# Patient Record
Sex: Male | Born: 1955 | Race: White | Hispanic: No | Marital: Married | Smoking: Never smoker
Health system: Southern US, Community
[De-identification: ages and names within clinical notes are randomized; demographics above are authoritative.]

## PROBLEM LIST (undated history)

## (undated) DIAGNOSIS — K56609 Unspecified intestinal obstruction, unspecified as to partial versus complete obstruction: Secondary | ICD-10-CM

## (undated) DIAGNOSIS — IMO0002 Reserved for concepts with insufficient information to code with codable children: Secondary | ICD-10-CM

## (undated) DIAGNOSIS — I10 Essential (primary) hypertension: Secondary | ICD-10-CM

## (undated) DIAGNOSIS — K649 Unspecified hemorrhoids: Secondary | ICD-10-CM

## (undated) DIAGNOSIS — E039 Hypothyroidism, unspecified: Secondary | ICD-10-CM

## (undated) DIAGNOSIS — C73 Malignant neoplasm of thyroid gland: Secondary | ICD-10-CM

## (undated) HISTORY — DX: Essential (primary) hypertension: I10

## (undated) HISTORY — PX: KNEE SURGERY: SHX244

## (undated) HISTORY — DX: Hypothyroidism, unspecified: E03.9

## (undated) HISTORY — DX: Unspecified hemorrhoids: K64.9

## (undated) HISTORY — DX: Reserved for concepts with insufficient information to code with codable children: IMO0002

## (undated) HISTORY — DX: Malignant neoplasm of thyroid gland: C73

## (undated) HISTORY — DX: Unspecified intestinal obstruction, unspecified as to partial versus complete obstruction: K56.609

## (undated) HISTORY — PX: THYROIDECTOMY: SHX17

---

## 2004-10-11 DIAGNOSIS — IMO0002 Reserved for concepts with insufficient information to code with codable children: Secondary | ICD-10-CM

## 2004-10-11 HISTORY — DX: Reserved for concepts with insufficient information to code with codable children: IMO0002

## 2008-10-11 DIAGNOSIS — K56609 Unspecified intestinal obstruction, unspecified as to partial versus complete obstruction: Secondary | ICD-10-CM

## 2008-10-11 HISTORY — DX: Unspecified intestinal obstruction, unspecified as to partial versus complete obstruction: K56.609

## 2009-04-20 ENCOUNTER — Inpatient Hospital Stay (HOSPITAL_COMMUNITY): Admission: EM | Admit: 2009-04-20 | Discharge: 2009-04-24 | Payer: Self-pay | Admitting: Emergency Medicine

## 2009-04-23 ENCOUNTER — Encounter (INDEPENDENT_AMBULATORY_CARE_PROVIDER_SITE_OTHER): Payer: Self-pay | Admitting: Gastroenterology

## 2010-06-24 IMAGING — CT CT ENTERO ABD W/CM
2 of 6 series · 16 of 46 positions shown, 18 images · IV contrast (APPLIED)
Comparison: CT abdomen pelvis 04/20/2009

CT ABDOMEN:

CLINICAL DATA: Abdominal pain small bowel obstruction. Concern for
Crohn's disease or ileitis.

CT ABDOMEN AND PELVIS WITH CONTRAST (CT ENTEROGRAPHY)
TECHNIQUE: Multidetector CT of the abdomen and pelvis during bolus
administration of intravenous contrast. Negative oral contrast
VoLumen was given.
Contrast: 100 ml Omnipaque 300 intravenously and 1448 ml VoLumen
orally.

[Series 3: abd/pelv with 2.0 b31f st · axial · 0.72mm/px · z∈[+794,+1314]mm · 13 of 292 slices shown, 15 images]
[im 16/292  soft-tissue]
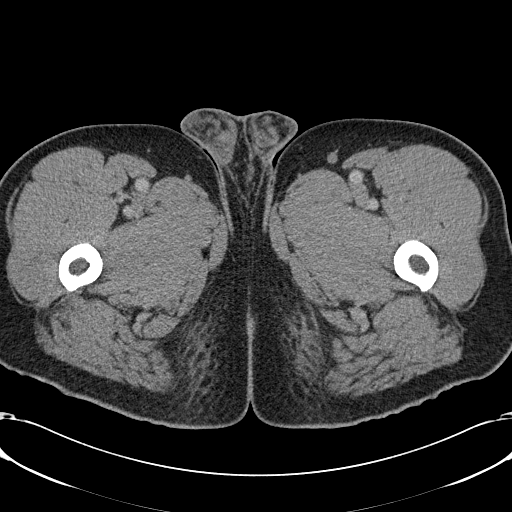
[im 16/292  bone]
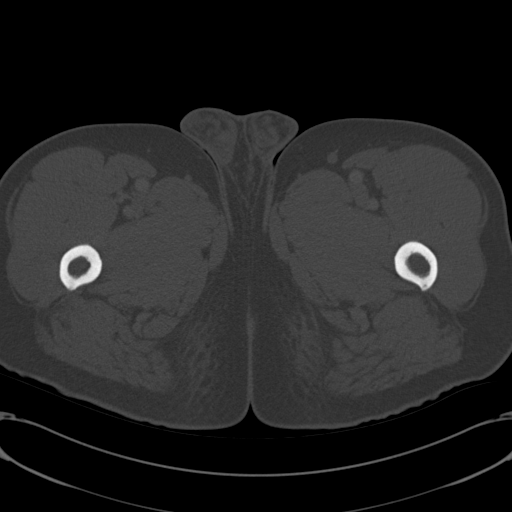
[im 46/292  soft-tissue]
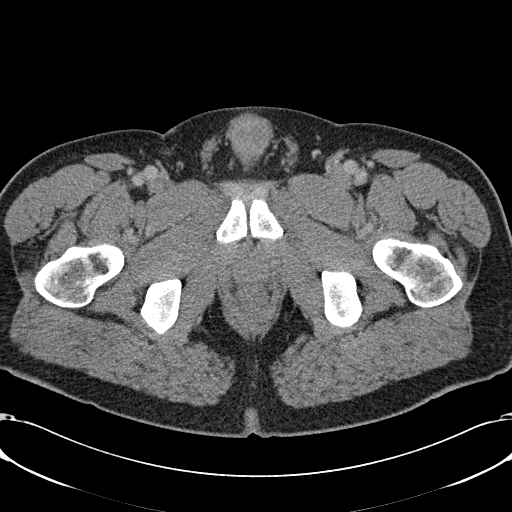
[im 62/292  soft-tissue]
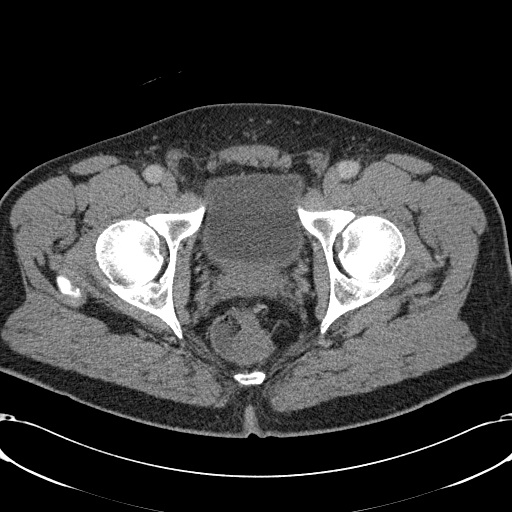
[im 77/292  soft-tissue]
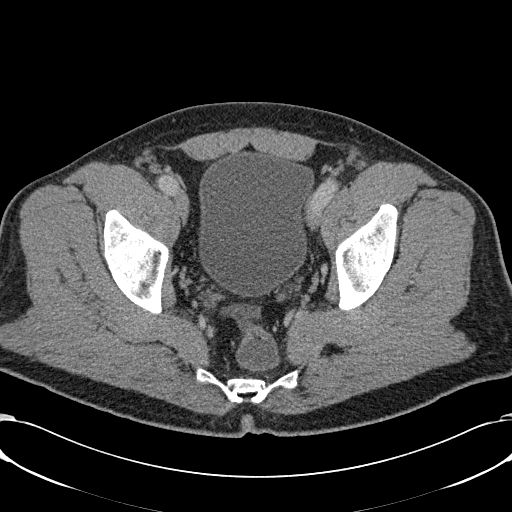
[im 108/292  soft-tissue]
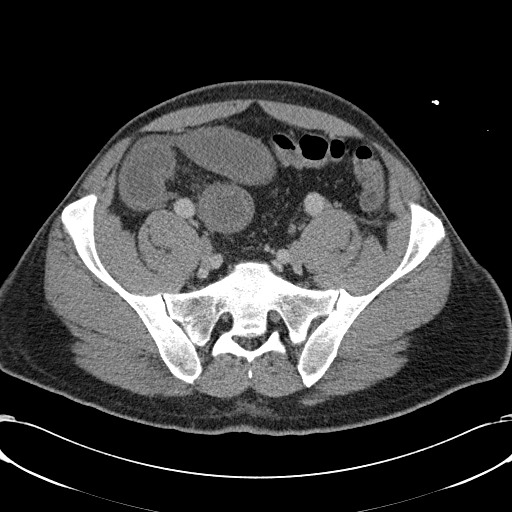
[im 123/292  soft-tissue]
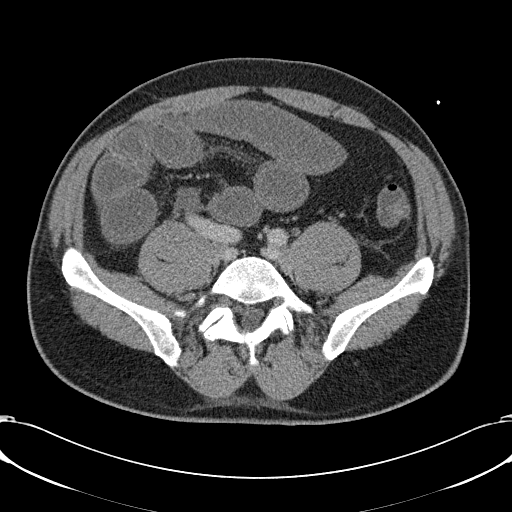
[im 154/292  soft-tissue]
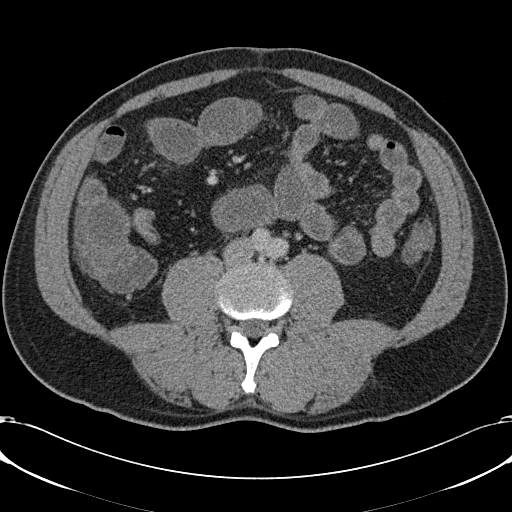
[im 169/292  soft-tissue]
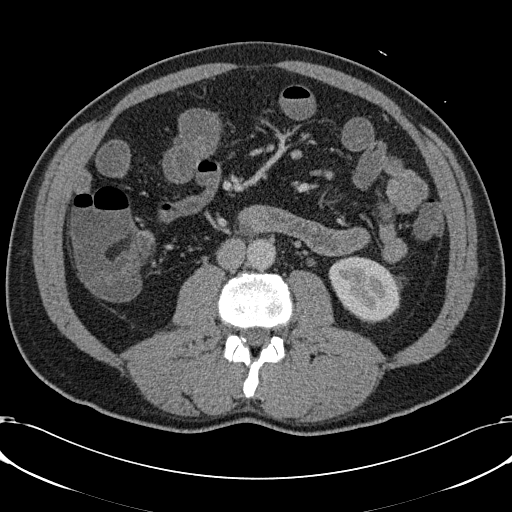
[im 184/292  soft-tissue]
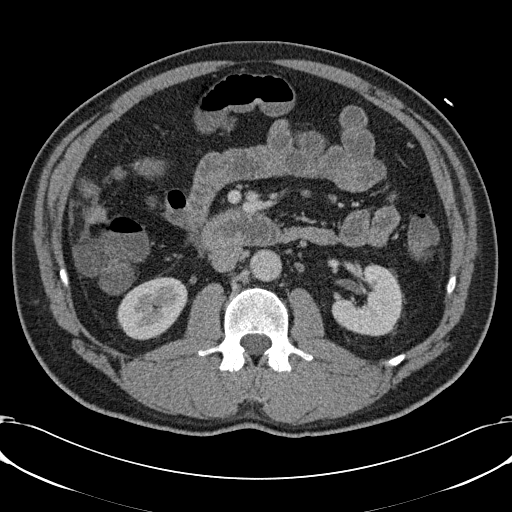
[im 184/292  bone]
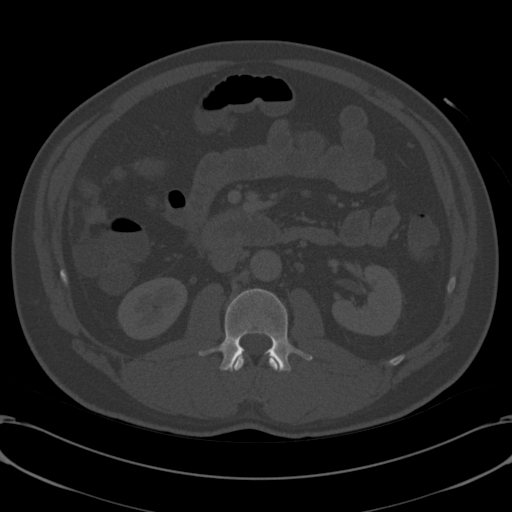
[im 215/292  soft-tissue]
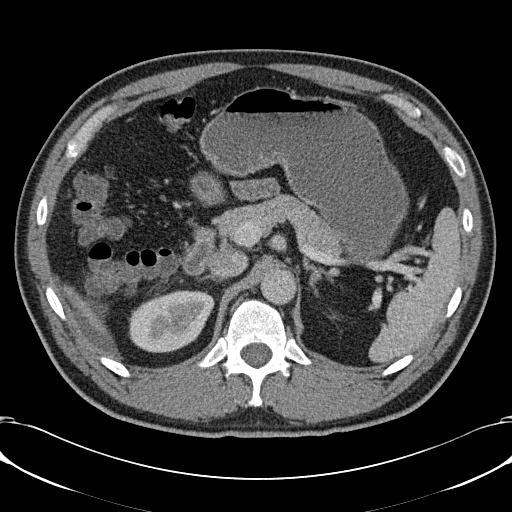
[im 230/292  soft-tissue]
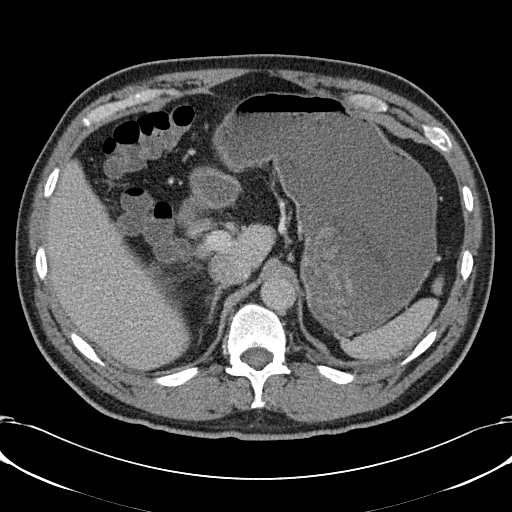
[im 246/292  soft-tissue]
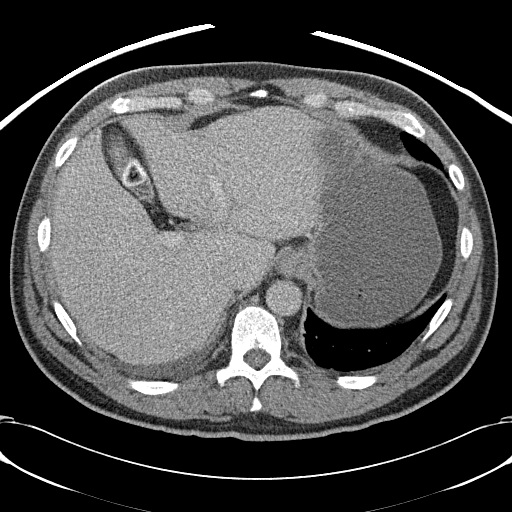
[im 276/292  soft-tissue]
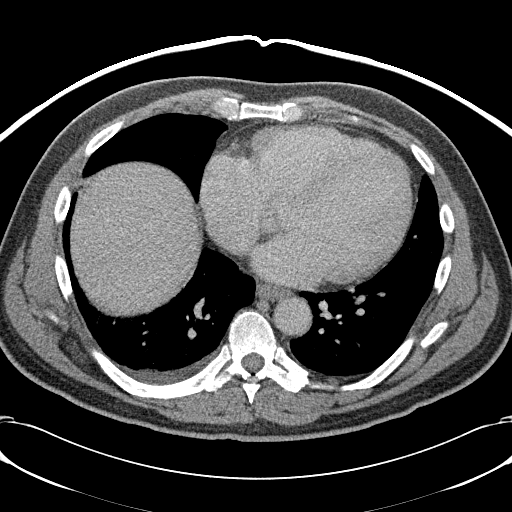

[Series 602: coronals · coronal · 1.14mm/px · 3 of 88 slices shown]
[im 30/88  soft-tissue]
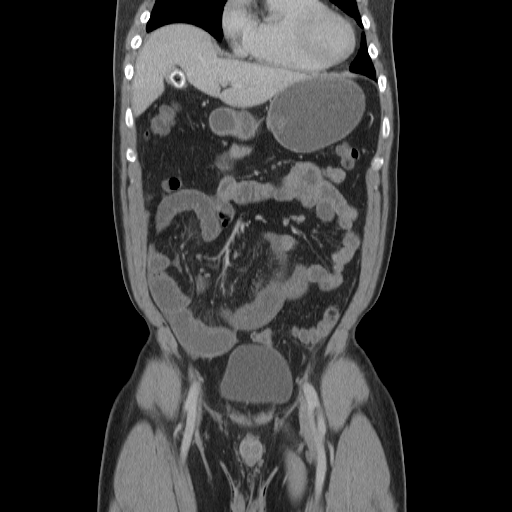
[im 39/88  soft-tissue]
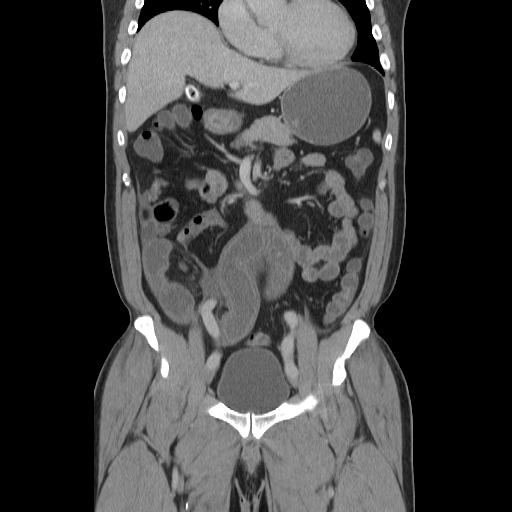
[im 49/88  soft-tissue]
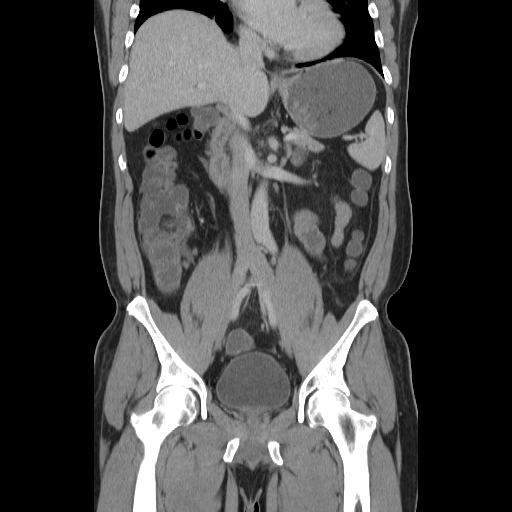

[16 of 46 positions shown; findings below may reference images not displayed]

FINDINGS: There is a new small right pleural effusion.  There is
mild right basilar atelectasis.  Heart appears normal.

No focal hepatic lesion.  There are multiple gallstones within
collapsed bladder.  No evidence of intrahepatic biliary ductal
dilatation.  The pancreas, spleen, adrenal glands, and kidneys
appear normal.

The stomach and duodenum appear normal.  The proximal small bowel
is not dilated and appears normal.  Within the mid ileum, there a
long segment of bowel wall thickening and a moderate amount of
dilatation (3.5 cm) with fluid retention.  The retained fecalalized
bowel contents seen on prior have been cleared  suggesting improved
partial small bowel obstruction.  The bowel wall thickening appears
slightly increased from prior measuring 6 mm compared to
approximately 2 mm on prior.  There is no clear focal persistent
stricturing or obstructing lesion.  The terminal ileum appears
normal without evidence of terminal ileitis.  The colon is
predominate collapsed but does contain a small amount of fluid.
There is fluid within the rectum and sigmoid colon.

Abdominal aorta is normal in caliber.  No evidence of
lymphadenopathy.
IMPRESSION: 1. Persistent long segment of the circumferential ileal bowel wall
thickening with improved partial obstruction.  The bowel wall
thickening appears slightly worsened.  There is no evidence of
inflammation at the terminal ileum to suggest typical Crohn's
disease.  Differential would include bowel wall edema from
infectious or inflammatory enteritis and  ischemia.  Bowel wall
hemorrhage or infiltration seems unlikely.

2. Cholelithiasis without evidence of cholecystitis.
3.  New right pleural effusion.

CT PELVIS:
FINDINGS: There is decrease in amount of free fluid in the pelvis.
The bladder prostate appear normal.  Rectum and sigmoid colon
appear normal.  No evidence of pelvic lymphadenopathy. Review of
bone windows demonstrates no aggressive osseous lesions.
IMPRESSION: Interval decrease in the smaller free fluid in the pelvis.

## 2011-01-17 LAB — CBC
HCT: 33.9 % — ABNORMAL LOW (ref 39.0–52.0)
HCT: 39.5 % (ref 39.0–52.0)
HCT: 43 % (ref 39.0–52.0)
Hemoglobin: 13.5 g/dL (ref 13.0–17.0)
MCHC: 34.3 g/dL (ref 30.0–36.0)
MCV: 93.7 fL (ref 78.0–100.0)
MCV: 93.8 fL (ref 78.0–100.0)
MCV: 94.2 fL (ref 78.0–100.0)
Platelets: 218 10*3/uL (ref 150–400)
Platelets: 250 10*3/uL (ref 150–400)
RBC: 4.59 MIL/uL (ref 4.22–5.81)
RDW: 13.1 % (ref 11.5–15.5)
RDW: 13.5 % (ref 11.5–15.5)
WBC: 12.1 10*3/uL — ABNORMAL HIGH (ref 4.0–10.5)

## 2011-01-17 LAB — BASIC METABOLIC PANEL
BUN: 12 mg/dL (ref 6–23)
BUN: 16 mg/dL (ref 6–23)
CO2: 29 mEq/L (ref 19–32)
Chloride: 105 mEq/L (ref 96–112)
Chloride: 108 mEq/L (ref 96–112)
Creatinine, Ser: 0.91 mg/dL (ref 0.4–1.5)
GFR calc non Af Amer: >60 mL/min (ref >60)
GFR calc non Af Amer: >60 mL/min (ref >60)
Glucose, Bld: 105 mg/dL — ABNORMAL HIGH (ref 70–99)
Glucose, Bld: 106 mg/dL — ABNORMAL HIGH (ref 70–99)
Potassium: 3.2 mEq/L — ABNORMAL LOW (ref 3.5–5.1)
Potassium: 3.4 mEq/L — ABNORMAL LOW (ref 3.5–5.1)
Sodium: 143 mEq/L (ref 135–145)

## 2011-01-17 LAB — URINALYSIS, ROUTINE W REFLEX MICROSCOPIC
Bilirubin Urine: NEGATIVE
Glucose, UA: NEGATIVE mg/dL
Hgb urine dipstick: NEGATIVE
Ketones, ur: NEGATIVE mg/dL
Protein, ur: NEGATIVE mg/dL
pH: 5.5 (ref 5.0–8.0)

## 2011-01-17 LAB — PROTIME-INR: INR: 1.1 (ref 0.00–1.49)

## 2011-01-17 LAB — HEPATIC FUNCTION PANEL
ALT: 20 U/L (ref 0–53)
ALT: 25 U/L (ref 0–53)
Albumin: 4.1 g/dL (ref 3.5–5.2)
Alkaline Phosphatase: 75 U/L (ref 39–117)
Bilirubin, Direct: <0.1 mg/dL (ref 0.0–0.3)
Indirect Bilirubin: 0.9 mg/dL (ref 0.3–0.9)
Total Bilirubin: 1.4 mg/dL — ABNORMAL HIGH (ref 0.3–1.2)
Total Protein: 6.3 g/dL (ref 6.0–8.3)

## 2011-01-17 LAB — DIFFERENTIAL
Basophils Absolute: 0 10*3/uL (ref 0.0–0.1)
Eosinophils Absolute: 0.2 10*3/uL (ref 0.0–0.7)
Eosinophils Absolute: 0.3 10*3/uL (ref 0.0–0.7)
Eosinophils Relative: 2 % (ref 0–5)
Eosinophils Relative: 2 % (ref 0–5)
Lymphocytes Relative: 13 % (ref 12–46)
Lymphocytes Relative: 8 % — ABNORMAL LOW (ref 12–46)
Lymphs Abs: 1 10*3/uL (ref 0.7–4.0)
Monocytes Absolute: 0.9 10*3/uL (ref 0.1–1.0)
Monocytes Relative: 5 % (ref 3–12)

## 2011-01-17 LAB — POCT I-STAT, CHEM 8
BUN: 20 mg/dL (ref 6–23)
Calcium, Ion: 1.05 mmol/L — ABNORMAL LOW (ref 1.12–1.32)
Chloride: 106 mEq/L (ref 96–112)
Glucose, Bld: 125 mg/dL — ABNORMAL HIGH (ref 70–99)
TCO2: 26 mmol/L (ref 0–100)

## 2011-01-17 LAB — LIPASE, BLOOD: Lipase: 18 U/L (ref 11–59)

## 2011-01-17 LAB — MAGNESIUM: Magnesium: 2.2 mg/dL (ref 1.5–2.5)

## 2011-02-23 NOTE — H&P (Signed)
Jared [name], Jared [name] NO.:  [id number]   MEDICAL RECORD NO.:  [id number]          PATIENT TYPE:  INP   LOCATION:  1823                         FACILITY:  [facility]   PHYSICIAN:  Lucile Crater, MD         DATE OF BIRTH:  1956-10-08   DATE OF ADMISSION:  04/20/2009  DATE OF DISCHARGE:                              HISTORY & PHYSICAL   PRIMARY CARE PHYSICIAN:  L. Lupe Carney, M.D.   CHIEF COMPLAINT:  Abdominal pain.   HISTORY OF PRESENT ILLNESS:  Mr.  [name] is a 55 year old Caucasian male  who presented to the emergency room complaining of abdominal pain.  The  pain actually started 3-4 days ago.  It was very mild in intensity when  it started.  It gradually worsened, and last night the pain was  unbearable, and so Jared [name] decided to come to the emergency room.  The  pain is located mostly in the epigastric region with no radiation. It is  tender.  It is described as a sharp pain.  There are no specific  aggravating or alleviating factors, but the pain was severe enough that  he was restless and moving around in bed.  The pain is associated with  nausea. He had an episode of vomiting which worsened the pain, but there  was no hematemesis.  There was no association of the abdominal pain with  bowel movements. He reports no change in bowel movements. He denies any  joint pains or recent changes.  His sister has a history of irritable  bowel syndrome as well as Crohn's disease.  He denies any fevers or  chills.  He denies having any bloody bowel movements. There is no  melena. There is no heartburn.   A CT scan of the abdomen was obtained in the emergency room, and it  showed evidence of small bowel obstruction, and there is a little area  of narrowing within the ilium and the right pelvis with apparent wall-  thickening/edema of the small bowel distal to this point.  This was  concerning for distal ileal inflammation/enteritis or Crohn's disease  with SBO.  Also, there  is moderate mesenteric inflammation and a small  amount of free fluid, but there is no evidence of pneumoperitoneum on  the CAT scan, and the appendix was visualized to be normal.   REVIEW OF SYSTEMS:  A complete review of systems was done,which include  general, head, eyes, ears, nose, and throat, cardiovascular,  respiratory, GI, GU, endocrine, musculoskeletal, skin, neurological, and  psychiatric. All are within normal limits other than what is mentioned  above.   PAST MEDICAL HISTORY:  1. Thyroid cancer status post total thyroidectomy.  2. Hypothyroidism status post thyroidectomy.  3. Hypertension.   ALLERGIES:  None.   MEDICATIONS AT HOME:  1. Diovan 160 or 220 mg once per day.  The patient is not sure about      the dose.  2. Levothyroxine 200 mcg 1 tablet p.o. daily.   SOCIAL HISTORY:  There is no history of tobacco abuse.  He  drinks  alcohol occasionally.  There is no history of drug use. He recently  moved from [place] to [place].  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   FAMILY HISTORY:  Positive for irritable bowel syndrome, diverticulitis,  Crohn's disease in a sister.   PHYSICAL EXAMINATION:  VITAL SIGNS:  T max 97.5, pulse rate 58-68, blood  pressure 139/87. Respirations 16.  O2 sat 97% on room air.  GENERAL:  Not in acute distress; alert, awake and oriented x3. Afebrile.  HEENT:  Normocephalic, atraumatic.  Pupils are equal, round, and  reactive to light and accommodation.  Extraocular movements are intact.  The mucous membranes are moist.  NECK: Supple.  No JVD, lymphadenopathy, or carotid bruits.  CARDIOVASCULAR:  Regular rhythm, rate is normal. No murmur, rubs or  gallops.  LUNGS:  Clear to auscultation bilaterally.  ABDOMEN: Soft. Tenderness most noted in the epigastric region.  Mild  tenderness in the right lower quadrant as well.  There is no rebound or  guarding.  There is no hepatosplenomegaly.  EXTREMITIES: No clubbing, cyanosis, or edema.   NEUROLOGICAL:  Exam is grossly nonfocal.   LABS AND STUDIES:  A CT scan of the abdomen and pelvis with contrast:  Evidence of small bowel obstruction. Relative area of narrowing is  identified within the ilium and the right pelvis with apparent wall-  thickening/edema of the small bowel distal to this point.  Distal ileal  inflammation/enteritis or Crohn's disease with small bowel obstruction  is a consideration.  Moderate mesenteric inflammation and small amount  of free fluid. There is no evidence of pneumoperitoneum.  Normal  appendix.   White blood cell count 12,100, hemoglobin 15.3, hematocrit 45, platelets  297, neutrophils 84%, lymphocytes 8%, monocytes 5%, eosinophils 2%,  basophils 0.  Sodium 138, potassium 4.5, chloride 106, BUN 20,  creatinine 1.2, blood glucose 125. Total protein 7.2, albumin 12.1, ALT  25, AST 45, alkaline phosphatase 75.  Total bilirubin 1.4, direct  bilirubin 0.5.  Urinalysis within normal limits.  Serum lipase is 18.   ASSESSMENT:  1. Abdominal pain.  There is evidence of small bowel obstruction with      a concern for Crohn's disease versus ileal inflammation on the CAT      scan.  A surgery consult was obtained we are still awaiting.  Will      make the patient NPO and start him on IV fluids to hydrate him.      Will control his pain.  Regarding the questionable ileal      inflammation/Crohn's disease, there is no history of Crohn's      before. The patient might benefit from a GI consult.  If there is      evidence of Crohn's disease on endoscopy and biopsy, likely will      start medications.  2. Hypertension. Will continue Diovan.  3. Hypothyroidism. Will check a TSH and will continue levothyroxine.  4. Deep vein thrombosis prophylaxis with sequential compression      devices.  5. Fluids, electrolytes and nutrition.  Will make the patient NPO.      Will replace electrolytes as needed.   DISPOSITION:  Will admit the patient to the floor and  closely observe.      Lucile Crater, MD  Electronically Signed     TA/MEDQ  D:  04/20/2009  T:  04/20/2009  Job:  [id number]   cc:   L. Lupe Carney, M.D.

## 2011-02-23 NOTE — Consult Note (Signed)
Jared [name], Jared [name] NO.:  [id number]   MEDICAL RECORD NO.:  [id number]          PATIENT TYPE:  INP   LOCATION:  5118                         FACILITY:  [facility]   PHYSICIAN:  Juanetta Gosling, MDDATE OF BIRTH:  [birth year]   DATE OF CONSULTATION:  DATE OF DISCHARGE:                                 CONSULTATION   PRIMARY CARE PHYSICIAN:  L. Lupe Carney, M.D.   CONSULTING PHYSICIAN:  Paula Libra, MD   CHIEF COMPLAINT:  Abdominal pain, nausea, and vomiting.   HISTORY OF PRESENT ILLNESS:  This is a 55 year old male who was  otherwise healthy, developed epigastric pain about 1300 yesterday.  It  became progressively worse and caused him to come to the emergency room.  He had no real relieving or aggravating factors.  He was nauseated, had  multiple episodes of bilious emesis.  He is still nauseated currently  and feels very bloated.  He has no appetite.  He has had a bowel  movement in the last 48 hours, and he is passing a small amount of  flatus but much decreased from normal.  Has had no fevers.  He had no  prior episodes of this sort of pain either.   PAST MEDICAL HISTORY:  Hypertension and hypothyroidism.   PAST SURGICAL HISTORY:  Total thyroidectomy for thyroid cancer, status  post radioactive iodine, and knee surgery.   MEDICATIONS:  Diovan and Synthroid.   ALLERGIES:  He has no known drug allergies.   SOCIAL HISTORY:  He is a nonsmoker.  Drinks occasional alcohol.  He is  attorney for the county.   FAMILY HISTORY:  Positive for diverticulitis.  He also has a sister with  Crohn disease and multiple other autoimmune diseases.   REVIEW OF SYSTEMS:  He has a negative cardiac history.  Had a  colonoscopy in the last 3 years with benign polyps that was obtained in  [place], [place].   PHYSICAL EXAMINATION:  VITAL SIGNS:  97.0, 68, 149/94, 14.  GENERAL:  He is well appearing, in no apparent distress.  HEART:  Regular rate and rhythm.  LUNGS:  Clear bilaterally.  ABDOMEN:  Moderately distended.  He has some occasional bowel sounds.  Mildly tender in his epigastrium and right lower quadrant without any  evidence of peritoneal signs.  There is no inguinal hernia.  EXTREMITIES:  No edema.   LABORATORY EVALUATION:  White blood cell count of 12.1, hematocrit 43,  and platelets 297 with a left shift of 84 segs.  His albumin is 4.1,  alkaline phosphatase 75, AST 45, ALT 25, total bilirubin 1.4, lipase 18.  Urinalysis negative for infection.  Sodium 138, potassium 4.5, chloride  106, CO2 of 26, BUN 20, creatinine 1.2, and glucose 125.  His CT scan  shows cholelithiasis and dilated small bowel loops with decompressed  loops near his terminal ileum associated with some inflammation.  He has  a normal appendix.   ASSESSMENT AND PLAN:  Small bowel obstruction.  He has no prior  abdominal surgery and no inguinal hernia on his examination, leaving  other etiologies  to include inflammatory bowel disease or tumors.  He  has a normal colonoscopy.  I do not really see a mass on his CT scan  indicating this well may be some sort of inflammatory bowel disease,  including Crohn.  He has no current indication for surgery, so we will  plan on IV fluids, n.p.o., and nasogastric tube placement to decompress  and, hopefully, this will resolve on its own and then we can take  further effort to determine the etiology of this bowel obstruction.  We  did discuss if this does not resolve there is a chance he may need  surgery.  I would also recommend a Gastroenterology consult.   Thank you for this consult.      Juanetta Gosling, MD  Electronically Signed     MCW/MEDQ  D:  04/20/2009  T:  04/20/2009  Job:  [id number]   cc:   Paula Libra, MD  L. Lupe Carney, M.D.

## 2011-02-23 NOTE — Op Note (Signed)
NAMECORNEY, KNIGHTON NO.:  [id number]   MEDICAL RECORD NO.:  [id number]          [name] TYPE:  INP   LOCATION:  5118                         FACILITY:  [facility]   PHYSICIAN:  Danise Edge, M.D.   DATE OF BIRTH:  11/18/1955   DATE OF PROCEDURE:  04/23/2009  DATE OF DISCHARGE:                               OPERATIVE REPORT   ADMISSION DIAGNOSIS:  Distal small bowel obstruction with thickening of  Jared distal ileum by admission CT scan of Jared abdomen and pelvis.   DIFFERENTIAL DIAGNOSES:  Crohn's ileitis, nonsteroidal anti-inflammatory  drug-induced ileitis, infectious ileitis, neoplasia, and ischemia.   HISTORY:  Jared [name] is a 55 year old male, born September 16, 1956.   Jared [name] was admitted to [facility] with a distal small  bowel obstruction demonstrated by CT scan of Jared abdomen and pelvis.   Jared [name] awoke with steadily increasing periumbilically abdominal  pain, which was associated with nausea and vomiting.  He had no prior  gastrointestinal symptoms leading up to this acute episode.  On  admission to Jared hospital, his white blood cell count was slightly  elevated.  Otherwise, his blood counts were normal.   Jared [name] does have chronic constipation, but denies abdominal pain,  anorexia, fevers, indigestion, heartburn, or gastrointestinal bleeding.   Approximately 2-3 years ago, Jared [name] tells me he underwent a  colonoscopy and colon polyps were removed.   Jared [name] has had no previous abdominal surgery.  He does not take  nonsteroidal anti-inflammatory medications.  His appetite has remained  normal.  He reports no weight loss.   MEDICATIONS ALLERGIES:  None.   CHRONIC MEDICATIONS:  Synthroid, Benicar, Lopressor.  Jared [name] is  also on Zofran for nausea.   PAST MEDICAL HISTORY:  1. Thyroidectomy for thyroid cancer.  2. Hypertension.  3. Hypothyroidism.  4. Gallstones.   FAMILY HISTORY:  Sister was diagnosed  with a Crohn disease and other  autoimmune diseases.   HABITS:  Jared [name] does not smoke cigarettes and consumes alcohol in  moderation.   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   PHYSICAL EXAMINATION:  GENERAL APPEARANCE:  Jared [name] appears alert  and comfortable.  HEENT:  Nonicteric sclerae.  Normal oropharynx.  NECK:  Thyroidectomy scar.  CARDIAC:  Regular rhythm without murmurs.  LUNGS:  Clear to auscultation.  ABDOMEN:  Soft, flat, and nontender.  SKIN:  Clear.   ENDOSCOPIST:  Danise Edge, MD   PREMEDICATIONS:  1. Fentanyl 100 mcg.  2. Versed 10 mg.   PROCEDURE:  After obtaining informed consent, Jared [name] was placed in  Jared left lateral decubitus position.  I administered intravenous  fentanyl and intravenous Versed to achieve conscious sedation for Jared  procedure.  Jared [name]'s blood pressure, oxygen saturation, and cardiac  rhythm were monitored throughout Jared procedure and documented in Jared  medical record.   Jared Pentax pediatric colonoscope was introduced into Jared rectum and  easily advanced to Jared hepatic flexure.  I had a great deal of  difficulty rounding Jared hepatic  flexure and entering Jared right colon.  Jared [name] required rolling from Jared left lateral decubitus position to  Jared supine position and finally, Jared right lateral decubitus position in  order to accomplish intubation of Jared cecum.  Colonic preparation for  Jared exam today was good.   Rectum normal.  Sigmoid colon and descending colon:  There are a few left colonic  diverticula without signs of diverticulitis.  Splenic flexure normal.  Transverse colon normal.  Hepatic flexure normal.  Descending colon:  In Jared proximal descending colon just distal to Jared  ileocecal valve, there is a 1-cm sessile and pedunculated polyp, which  required piecemeal removal with Jared electrocautery snare.  After  polypectomy, Jared polypectomy site was lifted by saline injection and   there could not identify any residual polypoid tissue.  Cecum:  At Jared base of Jared cecum, there is a 1-cm carpet-like polyp.  Jared polyp was removed in piecemeal fashion.  Saline lift was performed  in order to accomplish piecemeal polypectomy.  Pieces from both polyps  were gathered up in Jared [facility] and submitted in one bottle for  pathological evaluation.  Inspection of Jared distal ileum.  Jared terminal ileum was inspected and  Jared mucosa appeared completely normal.  There was no signs of mucosal  inflammation, ulceration, or tumors.   ASSESSMENT:  1. An 1-cm carpet-like polyp was removed from Jared proximal cecum.  2. An 1-cm sessile - pedunculated polyp was removed from Jared proximal      ascending colon just distal to Jared ileocecal valve.  3. Inspection of Jared ileocecal valve and terminal ileum was completely      normal and without signs of ileitis.  4. Left colonic diverticulosis.   RECOMMENDATIONS:  Jared [name]'s small bowel obstruction symptoms have  completely resolved.  Jared terminal ileum appears normal endoscopically.  Jared proximal and mid ileum was not evaluated.  A CT enterography could  be performed if Jared [name] redeveloped symptoms of small bowel  obstruction.           ______________________________  Danise Edge, M.D.     MJ/MEDQ  D:  04/23/2009  T:  04/24/2009  Job:  [id number]

## 2011-03-29 ENCOUNTER — Encounter: Payer: Self-pay | Admitting: Gastroenterology

## 2011-05-06 ENCOUNTER — Ambulatory Visit: Payer: Self-pay | Admitting: Gastroenterology

## 2011-05-17 ENCOUNTER — Encounter: Payer: Self-pay | Admitting: Gastroenterology

## 2011-06-10 ENCOUNTER — Other Ambulatory Visit (HOSPITAL_COMMUNITY): Payer: Self-pay | Admitting: Endocrinology

## 2011-06-10 DIAGNOSIS — C73 Malignant neoplasm of thyroid gland: Secondary | ICD-10-CM

## 2011-06-11 ENCOUNTER — Ambulatory Visit: Payer: Self-pay | Admitting: Gastroenterology

## 2011-06-21 ENCOUNTER — Ambulatory Visit (HOSPITAL_COMMUNITY): Payer: Self-pay

## 2011-06-22 ENCOUNTER — Ambulatory Visit (HOSPITAL_COMMUNITY): Payer: Self-pay

## 2011-06-23 ENCOUNTER — Ambulatory Visit (HOSPITAL_COMMUNITY): Payer: Self-pay

## 2011-06-25 ENCOUNTER — Encounter (HOSPITAL_COMMUNITY): Payer: Self-pay

## 2011-07-01 ENCOUNTER — Encounter: Payer: Self-pay | Admitting: Gastroenterology

## 2011-07-01 ENCOUNTER — Ambulatory Visit (INDEPENDENT_AMBULATORY_CARE_PROVIDER_SITE_OTHER): Payer: 59 | Admitting: Gastroenterology

## 2011-07-01 ENCOUNTER — Other Ambulatory Visit (INDEPENDENT_AMBULATORY_CARE_PROVIDER_SITE_OTHER): Payer: 59

## 2011-07-01 DIAGNOSIS — K649 Unspecified hemorrhoids: Secondary | ICD-10-CM

## 2011-07-01 DIAGNOSIS — Z860100 Personal history of colon polyps, unspecified: Secondary | ICD-10-CM | POA: Insufficient documentation

## 2011-07-01 DIAGNOSIS — K602 Anal fissure, unspecified: Secondary | ICD-10-CM | POA: Insufficient documentation

## 2011-07-01 DIAGNOSIS — K6289 Other specified diseases of anus and rectum: Secondary | ICD-10-CM | POA: Insufficient documentation

## 2011-07-01 DIAGNOSIS — Z8379 Family history of other diseases of the digestive system: Secondary | ICD-10-CM

## 2011-07-01 LAB — BASIC METABOLIC PANEL
BUN: 14 mg/dL (ref 6–23)
CO2: 29 mEq/L (ref 19–32)
Calcium: 9.1 mg/dL (ref 8.4–10.5)
Chloride: 102 mEq/L (ref 96–112)
Creatinine, Ser: 1.1 mg/dL (ref 0.4–1.5)
Glucose, Bld: 108 mg/dL — ABNORMAL HIGH (ref 70–99)

## 2011-07-01 LAB — CBC WITH DIFFERENTIAL/PLATELET
Eosinophils Absolute: 0.3 10*3/uL (ref 0.0–0.7)
Eosinophils Relative: 4.6 % (ref 0.0–5.0)
HCT: 41.7 % (ref 39.0–52.0)
Lymphs Abs: 1.6 10*3/uL (ref 0.7–4.0)
MCHC: 33.4 g/dL (ref 30.0–36.0)
MCV: 94.5 fl (ref 78.0–100.0)
Monocytes Absolute: 0.7 10*3/uL (ref 0.1–1.0)
Neutrophils Relative %: 63.2 % (ref 43.0–77.0)
Platelets: 293 10*3/uL (ref 150.0–400.0)
WBC: 7.3 10*3/uL (ref 4.5–10.5)

## 2011-07-01 LAB — FOLATE: Folate: >24.8 ng/mL (ref >5.9)

## 2011-07-01 LAB — HEPATIC FUNCTION PANEL
ALT: 38 U/L (ref 0–53)
Bilirubin, Direct: 0.1 mg/dL (ref 0.0–0.3)
Total Bilirubin: 0.6 mg/dL (ref 0.3–1.2)
Total Protein: 7.5 g/dL (ref 6.0–8.3)

## 2011-07-01 LAB — IBC PANEL: Transferrin: 265.1 mg/dL (ref 212.0–360.0)

## 2011-07-01 LAB — FERRITIN: Ferritin: 58.4 ng/mL (ref 22.0–322.0)

## 2011-07-01 LAB — VITAMIN B12: Vitamin B-12: 514 pg/mL (ref 211–911)

## 2011-07-01 MED ORDER — OXYCODONE-ACETAMINOPHEN 7.5-325 MG PO TABS: 1.0000 | ORAL_TABLET | Freq: Four times a day (QID) | ORAL | Status: DC | PRN

## 2011-07-01 MED ORDER — MESALAMINE 1000 MG RE SUPP: 1000.0000 mg | Freq: Every day | RECTAL | Status: AC

## 2011-07-01 MED ORDER — PEG-KCL-NACL-NASULF-NA ASC-C 100 G PO SOLR: 1.0000 | Freq: Once | ORAL | Status: DC

## 2011-07-01 MED ORDER — NITROGLYCERIN 2 % TD OINT: TOPICAL_OINTMENT | TRANSDERMAL | Status: DC

## 2011-07-01 NOTE — Progress Notes (Signed)
History of Present Illness:  This is a 55 year old Caucasian male self-referred for evaluation of rectal pain over the last year and a half worse over the last 3 months. He describes throbbing pain in his rectum daily, worse after a bowel movement occasional bright red blood per rectum. He has mild constipation and takes a stool softener twice a day. He also describes occasional throbbing discomfort in the left lower quadrant. He is a previous patient of Dr. Reece Agar, and had colonoscopy 2 years ago with removal of several colon adenomas. He has not had followup since that time. At that time he was hospitalized and had a CT scan which suggested possible Crohn's disease, but examination of the ileum and cecum on colonoscopy was unremarkable. Patient does have a sister who had severe Crohn's disease.  He denies symptoms of malabsorption, anorexia, weight loss, or any upper gastrointestinal, hepatobiliary, or systemic complaints such as fever or chills. He is use local Preparation H to his rectum with mild improvement. Patient is status post radioactive iodine treatment and also thyroidectomy for thyroid cancer. He is on Synthroid, and also medication for hypertension. Denies any food intolerances, and does not abuse alcohol, cigarettes, or NSAIDs. There is no past history of hepatitis, pancreatitis, or peptic ulcer disease.  I have reviewed this patient's present history, medical and surgical past history, allergies and medications.     ROS: The remainder of the 10 point ROS is negative     Physical Exam: General well developed well nourished patient in no acute distress, appearing his stated age Eyes PERRLA, no icterus, fundoscopic exam per opthamologist Skin no lesions noted Neck supple, no adenopathy, no thyroid enlargement, no tenderness.. transverse thyroidectomy scar noted . Chest clear to percussion and auscultation Heart no significant murmurs, gallops or rubs noted Abdomen no  hepatosplenomegaly masses or tenderness, BS normal.  Rectal external hemorrhoids are noted externally without any definite seizure or fistula. Rectal exam shows tenderness anteriorly, a palpable but not enlarged prostate, and soft stool which is guaiac-negative. Is no evidence of a thrombosed hemorrhoid. Extremities no acute joint lesions, edema, phlebitis or evidence of cellulitis. Psychological mental status normal and normal affect.  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 We will repeat his colonoscopy with propofol sedation cause of previous problems with his colonoscopy on review. I have prescribed Canasa 1 g suppositories at bedtime with local nitroglycerin gel locally 3 times a day, continue stool softeners with daily Benefiber and liberal by mouth fluids, and when necessary Percocet for pain. The labs also ordered for review.  Encounter Diagnosis  Name Primary?  . Hemorrhoids Yes

## 2011-07-01 NOTE — Patient Instructions (Signed)
Your procedure has been scheduled for 07/30/2011, please follow the seperate instructions.  Please go to the basement today for your labs.  Take Percocet as needed, we have given you a printed rx in office today.  Use Nitro gel per rectum three times a day, rx sent to your pharmacy. Use Canasa supp per rectum at night, rx sent to your pharmacy. Your Movi Prep has been sent to your pharmacy.  Buy Benefiber OTC and take once a day.  Anal Fissure An anal fissure often begins with sharp pain. This is usually following a bowel movement. It often causes bright red blood stained stools. It is the most common cause of rectal bleeding. One common cause of this is passage of a large, hard stool. It can also be caused by having frequent diarrheal stools. Anal fissures that occur for a longtime (chronic) may require surgery. CAUSES  Passing large, hard stools.   Frequent diarrheal stools.   Constipation.  SYMPTOMS  Bright red, blood stained stools.   Rectal bleeding.  HOME CARE INSTRUCTIONS  If constipation is the cause of the rectal fissure, it may be necessary to add a bulk-forming laxative. A diet high in fruits, whole grains, and vegetables will also help.   Taking hot sitz baths for 1 half hour 4 times per day may help.   Increase your fluid intake.   Only take over-the-counter or prescription medicines for pain, discomfort, or fever as directed by your caregiver. Do not take aspirin as this may increase the tendency for bleeding.   Do not use ointments containing anesthetic medications or hydrocortisone. They could slow healing.   Avoid constipating foods such as bananas and cheese.   In children, brown Karo syrup may be used by adding 1 teaspoon of syrup to 8 ounces of formula. An alternative is to give 3 teaspoons of mineral oil every day.  SEEK MEDICAL CARE IF: Rectal bleeding continues, changes in intensity, or becomes more severe. MAKE SURE YOU:   Understand these instructions.    Will watch your condition.   Will get help right away if you are not doing well or get worse.  Document Released: 09/27/2005 Document Re-Released: 12/22/2009 [facility] Patient Information 2011 [place], [place].

## 2011-07-08 ENCOUNTER — Other Ambulatory Visit (HOSPITAL_COMMUNITY): Payer: Self-pay | Admitting: Endocrinology

## 2011-07-08 DIAGNOSIS — C73 Malignant neoplasm of thyroid gland: Secondary | ICD-10-CM

## 2011-07-26 ENCOUNTER — Telehealth: Payer: Self-pay | Admitting: Gastroenterology

## 2011-07-26 NOTE — Telephone Encounter (Signed)
Pt with OV 07/01/11 for Chronic Anal Fissure with underlying Crohn's was given Canasa 1 G QHS with NTG gel and stool softeners; percocet for pain. COLON 07/30/11 at 2:30pm. Unable to reach pt- phone is unavailable.

## 2011-07-27 NOTE — Telephone Encounter (Signed)
lmom on [phone] for pt to call back. Number listed to call back was not accepting calls. Asked pt to let me know if he wanted me to ask DR Jarold Motto for advice or if he wanted the [place] refilled.

## 2011-07-28 MED ORDER — OXYCODONE-ACETAMINOPHEN 7.5-325 MG PO TABS: 1.0000 | ORAL_TABLET | Freq: Four times a day (QID) | ORAL | Status: DC | PRN

## 2011-07-28 NOTE — Telephone Encounter (Signed)
OK,I AGREE

## 2011-07-28 NOTE — Telephone Encounter (Signed)
Pt reports he's still suffering from pain from the anal fissure dx on 07/01/11 OV. He states the Canasa suppositories have helped, but the NTG burns. He would like to know if you will proceed with the COLON on 07/30/11 which I told him you probably will and if he can have a refill on Percocet; got 35 pills on 07/01/11? Please advise. Thanks.

## 2011-07-28 NOTE — Telephone Encounter (Signed)
Notified pt I will have his script at the front desk after Dr Jarold Motto signs the script.

## 2011-07-30 ENCOUNTER — Ambulatory Visit (AMBULATORY_SURGERY_CENTER): Payer: 59 | Admitting: Gastroenterology

## 2011-07-30 ENCOUNTER — Encounter: Payer: Self-pay | Admitting: Gastroenterology

## 2011-07-30 VITALS — BP 127/70 | HR 57 | Temp 97.8°F | Resp 20 | Ht 71.0 in | Wt 214.8 lb

## 2011-07-30 DIAGNOSIS — Z8379 Family history of other diseases of the digestive system: Secondary | ICD-10-CM

## 2011-07-30 DIAGNOSIS — K6289 Other specified diseases of anus and rectum: Secondary | ICD-10-CM

## 2011-07-30 DIAGNOSIS — K649 Unspecified hemorrhoids: Secondary | ICD-10-CM

## 2011-07-30 DIAGNOSIS — K602 Anal fissure, unspecified: Secondary | ICD-10-CM

## 2011-07-30 MED ORDER — LIDOCAINE-HYDROCORTISONE ACE 3-0.5 % RE CREA: 1.0000 | TOPICAL_CREAM | Freq: Two times a day (BID) | RECTAL | Status: DC

## 2011-07-30 MED ORDER — SODIUM CHLORIDE 0.9 % IV SOLN: 500.0000 mL | INTRAVENOUS | Status: DC

## 2011-07-30 NOTE — Progress Notes (Signed)
Pt uncomfortable while the scope was being advanced.  Sedation was titrated per Dr. Norval Gable orders.  Pt did relax and rested comfortably with his eyes closed while the scope was being withdrawn. maw

## 2011-08-02 ENCOUNTER — Telehealth: Payer: Self-pay | Admitting: *Deleted

## 2011-08-02 NOTE — Telephone Encounter (Signed)
No answer. Identifier on home phone. Message left to call if questions or concerns.

## 2011-08-03 ENCOUNTER — Telehealth: Payer: Self-pay | Admitting: *Deleted

## 2011-08-03 NOTE — Telephone Encounter (Signed)
lmom for pt to call back. Dr Jarold Motto ordered a surgical referral for pt for hemorrhoidectomy. Dr Harlon Flor, [facility], Nov. 7, 2012 at 0920, be there at 0900 am.

## 2011-08-04 NOTE — Telephone Encounter (Signed)
Spoke with Jared [name] to ask if he remembered Dr Jarold Motto suggesting he see a surgeon about his hemorrhoids. Jared [name] did not remember but will try to keep his appt with Dr Harlon Flor. Jared [name] is undergoing tx for Thyroid cancer and requested I mail the appt info to him- he was driving. Mailed.

## 2011-08-16 ENCOUNTER — Encounter (HOSPITAL_COMMUNITY)
Admission: RE | Admit: 2011-08-16 | Discharge: 2011-08-16 | Disposition: A | Discharge status: Home / Self Care | Payer: 59 | Source: Ambulatory Visit | Attending: Endocrinology | Admitting: Endocrinology

## 2011-08-16 DIAGNOSIS — C73 Malignant neoplasm of thyroid gland: Secondary | ICD-10-CM

## 2011-08-16 MED ORDER — THYROTROPIN ALFA 1.1 MG IM SOLR
0.9000 mg | INTRAMUSCULAR | Status: AC
  Administered 2011-08-16: 0.9 mg via INTRAMUSCULAR
  Filled 2011-08-16: qty 0.9

## 2011-08-17 ENCOUNTER — Encounter (INDEPENDENT_AMBULATORY_CARE_PROVIDER_SITE_OTHER): Payer: Self-pay | Admitting: Surgery

## 2011-08-17 ENCOUNTER — Ambulatory Visit (HOSPITAL_COMMUNITY)
Admission: RE | Admit: 2011-08-17 | Discharge: 2011-08-17 | Disposition: A | Discharge status: Home / Self Care | Payer: 59 | Source: Ambulatory Visit | Attending: Endocrinology | Admitting: Endocrinology

## 2011-08-17 MED ORDER — THYROTROPIN ALFA 1.1 MG IM SOLR
0.9000 mg | INTRAMUSCULAR | Status: AC
  Administered 2011-08-17: 0.9 mg via INTRAMUSCULAR

## 2011-08-17 MED FILL — Thyrotropin Alfa For Inj 1.1 MG: INTRAMUSCULAR | Qty: 0.9 | Status: AC

## 2011-08-18 ENCOUNTER — Encounter (INDEPENDENT_AMBULATORY_CARE_PROVIDER_SITE_OTHER): Payer: Self-pay | Admitting: Surgery

## 2011-08-18 ENCOUNTER — Ambulatory Visit (HOSPITAL_COMMUNITY): Payer: 59

## 2011-08-18 ENCOUNTER — Ambulatory Visit (INDEPENDENT_AMBULATORY_CARE_PROVIDER_SITE_OTHER): Payer: 59 | Admitting: Surgery

## 2011-08-18 VITALS — BP 132/80 | HR 68 | Temp 96.9°F | Resp 14 | Ht 71.5 in | Wt 216.6 lb

## 2011-08-18 DIAGNOSIS — K602 Anal fissure, unspecified: Secondary | ICD-10-CM

## 2011-08-18 MED ORDER — AMBULATORY NON FORMULARY MEDICATION: Status: DC

## 2011-08-18 NOTE — Progress Notes (Signed)
Chief Complaint  Patient presents with  . Rectal Problems    Eval hemorrhoids    HPI Jared [name] is a 55 y.o. male.  He presents in referral from Dr. Jarold Motto for evaluation of perianal pain. The patient has had intermittent symptoms for about 6 months. He has constant low-grade pain exacerbated by bowel movements. The pain gets quite severe. Occasionally he sees some blood on toilet paper. His bowel movements are daily but they alternate between constipation and loose bowel movements. His bowel movements did improve slightly when he was taking some Colace. Dr. Jarold Motto evaluated him and started him on some nitroglycerin ointment which has not really helped. A recent colonoscopy was unremarkable except for some small external hemorrhoids. He presents now for further evaluation. HPI  Past Medical History  Diagnosis Date  . Small bowel obstruction 2010  . Thyroid cancer   . Hypertension   . Hypothyroidism   . Hemorrhoids     Past Surgical History  Procedure Date  . Thyroidectomy     radioactive iodine  . Knee surgery     Family History  Problem Relation Age of Onset  . Crohn's disease Sister   . Lung cancer Father   . Cancer Father     lung    Social History History  Substance Use Topics  . Smoking status: Never Smoker   . Smokeless tobacco: Never Used  . Alcohol Use: Yes     occasional     Allergies  Allergen Reactions  . Latex Rash    Mild red rash were touched.    Current Outpatient Prescriptions  Medication Sig Dispense Refill  . AMBULATORY NON FORMULARY MEDICATION Medication Name: Diltiazem 2% ointment Apply as directed QID   15 g  0  . Levothyroxine Sodium (SYNTHROID PO) Take 225 mg by mouth daily.        Marland Kitchen lidocaine-hydrocortisone (ANAMANTEL HC) 3-0.5 % CREA Place 1 Applicatorful rectally 2 (two) times daily.  1 Tube  3  . mesalamine (CANASA) 1000 MG suppository Place 1 suppository (1,000 mg total) rectally at bedtime.  30 suppository  1  .  nitroGLYCERIN (NITROGLYN) 2 % ointment Apply to rectum three times a day  30 g  1  . oxyCODONE-acetaminophen (PERCOCET) 7.5-325 MG per tablet Take 1 tablet by mouth every 6 (six) hours as needed for pain.  35 tablet  0  . valsartan-hydrochlorothiazide (DIOVAN HCT) 320-25 MG per tablet Take 1 tablet by mouth daily.          Review of Systems Review of Systems  Constitutional: Negative for fever, chills and unexpected weight change.  HENT: Negative for hearing loss, congestion, sore throat, trouble swallowing and voice change.   Eyes: Negative for visual disturbance.  Respiratory: Negative for cough and wheezing.   Cardiovascular: Negative for chest pain, palpitations and leg swelling.  Gastrointestinal: Positive for diarrhea, constipation, blood in stool, anal bleeding and rectal pain. Negative for nausea, vomiting, abdominal pain and abdominal distention.  Genitourinary: Negative for hematuria and difficulty urinating.  Musculoskeletal: Negative for arthralgias.  Skin: Negative for rash and wound.  Neurological: Negative for seizures, syncope, weakness and headaches.  Hematological: Negative for adenopathy. Does not bruise/bleed easily.  Psychiatric/Behavioral: Negative for confusion.    Blood pressure 132/80, pulse 68, temperature 96.9 F (36.1 C), temperature source Temporal, resp. rate 14, height 5' 11.5" (1.816 m), weight 216 lb 9.6 oz (98.249 kg).  Physical Exam Physical Exam WDWN in NAD HEENT:  EOMI, sclera anicteric Neck:  No  masses, no thyromegaly Lungs:  CTA bilaterally; normal respiratory effort CV:  Regular rate and rhythm; no murmurs Abd:  +bowel sounds, soft, non-tender, no masses Rectal:  Small soft external hemorrhoids with no sign of inflammation or thrombosis.  Tender midline fissures anterior and posterior.  No sign of perirectal abscess or fistula Ext:  Well-perfused; no edema Skin:  Warm, dry; no sign of jaundice  Data Reviewed Colonscopy  report  Assessment    Anterior and posterior midline anal fissures Chronic constipation    Plan    Daily fiber supplement Stool softeners as needed Sitz baths when possible Diltiazem ointment  Recheck in 4 weeks.       Estha Few K. 08/18/2011, 10:46 AM

## 2011-08-18 NOTE — Patient Instructions (Signed)
Use a daily fiber supplement to help regulate your bowel movements. Frequent sitz baths Apply the diltiazem ointment as directed four times a day. Use baby wipes

## 2011-08-20 ENCOUNTER — Ambulatory Visit (HOSPITAL_COMMUNITY)
Admission: RE | Admit: 2011-08-20 | Discharge: 2011-08-20 | Disposition: A | Discharge status: Home / Self Care | Payer: 59 | Source: Ambulatory Visit | Attending: Endocrinology | Admitting: Endocrinology

## 2011-08-20 DIAGNOSIS — C73 Malignant neoplasm of thyroid gland: Secondary | ICD-10-CM | POA: Insufficient documentation

## 2011-08-20 DIAGNOSIS — Z9889 Other specified postprocedural states: Secondary | ICD-10-CM | POA: Insufficient documentation

## 2011-08-20 MED ORDER — SODIUM IODIDE I 131 CAPSULE
4.0000 | Freq: Once | INTRAVENOUS | Status: AC | PRN
  Administered 2011-08-20: 4 via ORAL

## 2011-08-23 LAB — THYROGLOBULIN ANTIBODY: Thyroglobulin Ab: <20 U/mL (ref <40.0)

## 2011-08-23 LAB — THYROGLOBULIN LEVEL: Thyroglobulin: <0.2 ng/mL (ref 0.0–55.0)

## 2011-08-31 ENCOUNTER — Other Ambulatory Visit: Payer: Self-pay | Admitting: Gastroenterology

## 2011-08-31 MED ORDER — OXYCODONE-ACETAMINOPHEN 7.5-325 MG PO TABS: 1.0000 | ORAL_TABLET | Freq: Four times a day (QID) | ORAL | Status: AC | PRN

## 2011-08-31 NOTE — Telephone Encounter (Signed)
lmom that his script is at the front desk.

## 2011-09-13 ENCOUNTER — Encounter (INDEPENDENT_AMBULATORY_CARE_PROVIDER_SITE_OTHER): Payer: 59 | Admitting: Surgery

## 2011-10-19 ENCOUNTER — Encounter (INDEPENDENT_AMBULATORY_CARE_PROVIDER_SITE_OTHER): Payer: Self-pay | Admitting: Surgery

## 2011-10-19 ENCOUNTER — Ambulatory Visit (INDEPENDENT_AMBULATORY_CARE_PROVIDER_SITE_OTHER): Payer: 59 | Admitting: Surgery

## 2011-10-19 VITALS — BP 134/90 | HR 55 | Temp 97.7°F | Ht 71.5 in | Wt 214.0 lb

## 2011-10-19 DIAGNOSIS — K602 Anal fissure, unspecified: Secondary | ICD-10-CM

## 2011-10-19 MED ORDER — AMBULATORY NON FORMULARY MEDICATION: Status: DC

## 2011-10-19 NOTE — Patient Instructions (Signed)
Stool softeners Continue to use diltiazem ointment.

## 2011-10-19 NOTE — Progress Notes (Signed)
Follow-up for anal fissures.  He was using Diltiazem ointment with significant improvement in his symptoms.  Unfortunately, he had a two-week battle with flu symptoms with frequent diarrhea.  This caused some perirectal discomfort, but this seems to be improving.  He has small non-inflamed external hemorrhoids.  No sign of anterior midline fissure.  Very tiny posterior midline fissure with some minimal discomfort.  Continue bowel regimen and Diltiazem until symptoms completely resolved.  Follow-up PRn  Wilmon Arms. Corliss Skains, MD, [facility]  10/19/2011 11:06 AM

## 2011-10-22 ENCOUNTER — Other Ambulatory Visit: Payer: Self-pay | Admitting: Gastroenterology

## 2011-10-22 NOTE — Telephone Encounter (Signed)
Per Dr Jarold Motto Patient can have 5% xylocaine cream but no pain meds. I offered patient the cream but he states that he already has some he will get filled.

## 2011-10-22 NOTE — Telephone Encounter (Signed)
5% XYLOCAINE CREAM..NO NARCOTICS

## 2011-10-22 NOTE — Telephone Encounter (Signed)
Patient states that he is having rectal pain again he developed some diarrhea and now having rectal pain, he just refilled his rectal cream and is going to use it again. He last had the precocet filled in November. Is it ok to fill Percocet again? Does he need need to follow up next week since he has not been seen since his colonoscopy in October and this will be his second refill on the percocet since his colonoscopy?

## 2011-12-29 ENCOUNTER — Institutional Professional Consult (permissible substitution): Payer: 59 | Admitting: Family Medicine

## 2012-01-27 ENCOUNTER — Encounter: Payer: Self-pay | Admitting: *Deleted

## 2017-05-20 ENCOUNTER — Encounter: Payer: Self-pay | Admitting: Vascular Surgery

## 2017-05-23 ENCOUNTER — Encounter: Payer: Self-pay | Admitting: Vascular Surgery

## 2017-05-23 ENCOUNTER — Ambulatory Visit (INDEPENDENT_AMBULATORY_CARE_PROVIDER_SITE_OTHER): Payer: 59 | Admitting: Vascular Surgery

## 2017-05-23 VITALS — BP 137/87 | HR 60 | Temp 98.0°F | Resp 16 | Ht 70.75 in | Wt 223.9 lb

## 2017-05-23 DIAGNOSIS — I868 Varicose veins of other specified sites: Secondary | ICD-10-CM

## 2017-05-23 DIAGNOSIS — I83893 Varicose veins of bilateral lower extremities with other complications: Secondary | ICD-10-CM

## 2017-05-23 DIAGNOSIS — I83891 Varicose veins of right lower extremities with other complications: Secondary | ICD-10-CM | POA: Insufficient documentation
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
## 2017-05-27 NOTE — Addendum Note (Signed)
Addended by: Lianne Cure A on: 05/27/2017 08:49 AM   Modules accepted: Orders

## 2017-08-23 ENCOUNTER — Other Ambulatory Visit: Payer: Self-pay

## 2017-08-23 ENCOUNTER — Encounter (HOSPITAL_COMMUNITY): Payer: 59

## 2017-08-23 ENCOUNTER — Ambulatory Visit: Payer: 59 | Admitting: Vascular Surgery

## 2017-08-23 ENCOUNTER — Encounter: Payer: Self-pay | Admitting: Vascular Surgery

## 2017-08-23 ENCOUNTER — Ambulatory Visit (HOSPITAL_COMMUNITY)
Admission: RE | Admit: 2017-08-23 | Discharge: 2017-08-23 | Disposition: A | Discharge status: Home / Self Care | Payer: 59 | Source: Ambulatory Visit | Attending: Vascular Surgery | Admitting: Vascular Surgery

## 2017-08-23 VITALS — BP 159/93 | HR 53 | Temp 98.0°F | Resp 18 | Ht 71.0 in | Wt 215.0 lb

## 2017-08-23 DIAGNOSIS — I83893 Varicose veins of bilateral lower extremities with other complications: Secondary | ICD-10-CM | POA: Insufficient documentation
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
## 2017-08-23 NOTE — Progress Notes (Signed)
Vitals:   08/23/17 0952  BP: (!) 153/85  Pulse: (!) 54  Resp: 18  Temp: 98 F (36.7 C)  TempSrc: Oral  SpO2: 98%  Weight: 215 lb (97.5 kg)  Height: 5\' 11"  (1.803 m)

## 2017-09-08 ENCOUNTER — Other Ambulatory Visit: Payer: Self-pay | Admitting: *Deleted

## 2017-09-08 DIAGNOSIS — I83893 Varicose veins of bilateral lower extremities with other complications: Secondary | ICD-10-CM

## 2017-09-28 ENCOUNTER — Ambulatory Visit: Payer: 59 | Admitting: Vascular Surgery

## 2017-09-28 ENCOUNTER — Encounter: Payer: Self-pay | Admitting: Vascular Surgery

## 2017-09-28 VITALS — BP 146/90 | HR 54 | Temp 98.0°F | Resp 20 | Ht 71.0 in | Wt 221.0 lb

## 2017-09-28 DIAGNOSIS — I83892 Varicose veins of left lower extremities with other complications: Secondary | ICD-10-CM

## 2017-09-28 HISTORY — PX: ENDOVENOUS ABLATION SAPHENOUS VEIN W/ LASER: SUR449

## 2017-09-28 NOTE — Progress Notes (Signed)
Subjective:     Patient ID: Jared [name], male   DOB: 1956/05/27, 61 y.o.   MRN: [id number]  HPI This 62 year old male had laser ablation left great saphenous vein from the proximal calf to near the saphenofemoral junction plus greater than 20 stab phlebectomy of painful varicosities performed under local tumescent anesthesia. A total of 2782 J of energy was utilized. He did have a large caliber vein with high flow and therefore procedure was performed at 3 W. He tolerated the procedure well.    Review of Systems     Objective:   Physical Exam BP (!) 146/90 (BP Location: Left Arm, Patient Position: Sitting, Cuff Size: Normal)   Pulse (!) 54   Temp 98 F (36.7 C) (Oral)   Resp 20   Ht 5\' 11"  (1.803 m)   Wt 221 lb (100.2 kg)   SpO2 98%   BMI 30.82 kg/m       Assessment:     Well-tolerated laser ablation left great saphenous vein plus graven 20 stab phlebectomy of painful varicosities performed under local tumescent anesthesia    Plan:     Return in 1 week for venous duplex exam to confirm closure left great saphenous vein

## 2017-09-28 NOTE — Progress Notes (Signed)
Laser Ablation Procedure    Date: 09/28/2017   Jared [name] DOB:1956-02-28  Consent signed: Yes    Surgeon:  Dr. Nelda Severe. Kellie Simmering  Procedure: Laser Ablation: left Greater Saphenous Vein  BP (!) 146/90 (BP Location: Left Arm, Patient Position: Sitting, Cuff Size: Normal)   Pulse (!) 54   Temp 98 F (36.7 C) (Oral)   Resp 20   Ht 5\' 11"  (1.803 m)   Wt 221 lb (100.2 kg)   SpO2 98%   BMI 30.82 kg/m   Tumescent Anesthesia: 475 cc 0.9% NaCl with 50 cc Lidocaine HCL 1% and 15 cc 8.4% NaHCO3  Local Anesthesia: 6 cc Lidocaine HCL and NaHCO3 (ratio 2:1)  Pulsed Mode: 17 watts, 5110ms delay, 1.0 duration  Total Energy: 2782 Joules             Total Pulses: 164               Total Time: 2:44    Stab Phlebectomy: > 20  Sites: Calf and Ankle  Patient tolerated procedure well  Notes: Latex allergy non-latex gloves used.  Description of Procedure:  After marking the course of the secondary varicosities, the patient was placed on the operating table in the supine position, and the left leg was prepped and draped in sterile fashion.   Local anesthetic was administered and under ultrasound guidance the saphenous vein was accessed with a micro needle and guide wire; then the mirco puncture sheath was placed.  A guide wire was inserted saphenofemoral junction , followed by a 5 french sheath.  The position of the sheath and then the laser fiber below the junction was confirmed using the ultrasound.  Tumescent anesthesia was administered along the course of the saphenous vein using ultrasound guidance. The patient was placed in Trendelenburg position and protective laser glasses were placed on patient and staff, and the laser was fired at 17 watts continuous mode advancing 1-61mm/second for a total of 2782 joules.   For stab phlebectomies, local anesthetic was administered at the previously marked varicosities, and tumescent anesthesia was administered around the vessels.  Greater than 20 stab wounds  were made using the tip of an 11 blade. And using the vein hook, the phlebectomies were performed using a hemostat to avulse the varicosities.  Adequate hemostasis was achieved.     Steri strips were applied to the stab wounds and ABD pads and thigh high compression stockings were applied.  Ace wrap bandages were applied over the phlebectomy sites and at the top of the saphenofemoral junction. Blood loss was less than 15 cc.  The patient ambulated out of the operating room having tolerated the procedure well.

## 2017-09-29 ENCOUNTER — Encounter: Payer: Self-pay | Admitting: Vascular Surgery

## 2017-10-10 ENCOUNTER — Ambulatory Visit: Payer: 59 | Admitting: Vascular Surgery

## 2017-10-10 ENCOUNTER — Ambulatory Visit (HOSPITAL_COMMUNITY)
Admission: RE | Admit: 2017-10-10 | Discharge: 2017-10-10 | Disposition: A | Discharge status: Home / Self Care | Payer: 59 | Source: Ambulatory Visit | Attending: Vascular Surgery | Admitting: Vascular Surgery

## 2017-10-10 ENCOUNTER — Encounter: Payer: Self-pay | Admitting: Vascular Surgery

## 2017-10-10 VITALS — BP 145/86 | HR 43 | Temp 97.8°F | Resp 20 | Ht 71.0 in | Wt 223.0 lb

## 2017-10-10 DIAGNOSIS — I83893 Varicose veins of bilateral lower extremities with other complications: Secondary | ICD-10-CM | POA: Diagnosis not present

## 2017-10-10 DIAGNOSIS — Z9889 Other specified postprocedural states: Secondary | ICD-10-CM | POA: Diagnosis not present
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
## 2017-10-19 ENCOUNTER — Other Ambulatory Visit: Payer: Self-pay | Admitting: *Deleted

## 2017-10-19 DIAGNOSIS — I83893 Varicose veins of bilateral lower extremities with other complications: Secondary | ICD-10-CM

## 2017-11-15 ENCOUNTER — Ambulatory Visit: Payer: 59 | Admitting: Vascular Surgery

## 2017-11-15 ENCOUNTER — Encounter: Payer: Self-pay | Admitting: Vascular Surgery

## 2017-11-15 VITALS — BP 140/80 | HR 46 | Temp 97.2°F | Resp 18 | Ht 71.5 in | Wt 218.0 lb

## 2017-11-15 DIAGNOSIS — I83891 Varicose veins of right lower extremities with other complications: Secondary | ICD-10-CM

## 2017-11-15 HISTORY — PX: ENDOVENOUS ABLATION SAPHENOUS VEIN W/ LASER: SUR449

## 2017-11-15 NOTE — Progress Notes (Signed)
Subjective:     Patient ID: Jared [name], male   DOB: 11-Dec-1955, 62 y.o.   MRN: [id number]  HPI This 62 year old male had laser ablation of the right small saphenous vein performed under local tumescent anesthesia. A total of 1169 J of energy was utilized. He tolerated the procedure well.  Review of Systems     Objective:   Physical Exam BP 140/80 (BP Location: Left Arm, Patient Position: Sitting, Cuff Size: Normal)   Pulse (!) 46   Temp (!) 97.2 F (36.2 C) (Oral)   Resp 18   Ht 5' 11.5" (1.816 m)   Wt 218 lb (98.9 kg)   SpO2 98%   BMI 29.98 kg/m        Assessment:     Well-tolerated laser ablation right small saphenous vein performed under local tumescent anesthesia    Plan:     Return in 1 week for venous duplex exam confirmed closure right small saphenous vein Patient will then be scheduled for laser ablation right great saphenous vein with multiple stab phlebectomy of painful varicosities

## 2017-11-15 NOTE — Progress Notes (Signed)
Laser Ablation Procedure    Date: 11/15/2017   Jared [name] DOB:May 29, 1956  Consent signed: Yes    Surgeon:  Dr. Nelda Severe. Kellie Simmering  Procedure: Laser Ablation: right Small Saphenous Vein  BP 140/80 (BP Location: Left Arm, Patient Position: Sitting, Cuff Size: Normal)   Pulse (!) 46   Temp (!) 97.2 F (36.2 C) (Oral)   Resp 18   Ht 5' 11.5" (1.816 m)   Wt 218 lb (98.9 kg)   SpO2 98%   BMI 29.98 kg/m   Tumescent Anesthesia: 375 cc 0.9% NaCl with 50 cc Lidocaine HCL  1% and 15 cc 8.4% NaHCO3  Local Anesthesia: 2 cc Lidocaine HCL and NaHCO3 (ratio 2:1)  Pulsed Mode: 15 watts, 561ms delay, 1.0 duration  Total Energy:  1169 Joules            Total Pulses:  79              Total Time: 1:18      Patient tolerated procedure well  Notes: Latex allergy/non-latex gloves used during procedure  Description of Procedure:  After marking the course of the secondary varicosities, the patient was placed on the operating table in the prone position, and the right leg was prepped and draped in sterile fashion.   Local anesthetic was administered and under ultrasound guidance the saphenous vein was accessed with a micro needle and guide wire; then the mirco puncture sheath was placed.  A guide wire was inserted saphenopopliteal junction , followed by a 5 french sheath.  The position of the sheath and then the laser fiber below the junction was confirmed using the ultrasound.  Tumescent anesthesia was administered along the course of the saphenous vein using ultrasound guidance. The patient was placed in Trendelenburg position and protective laser glasses were placed on patient and staff, and the laser was fired at 15 watts continuous mode advancing 1-48mm/second for a total of 1169 joules.      Steri strips were applied to the IV insertion site and ABD pads and thigh high compression stockings were applied.  Ace wrap bandages were applied over the right calf and lower thigh and at the top of the  saphenopopliteal junction. Blood loss was less than 15 cc.  The patient ambulated out of the operating room having tolerated the procedure well.

## 2017-11-28 ENCOUNTER — Ambulatory Visit (INDEPENDENT_AMBULATORY_CARE_PROVIDER_SITE_OTHER): Payer: 59 | Admitting: Vascular Surgery

## 2017-11-28 ENCOUNTER — Other Ambulatory Visit: Payer: Self-pay

## 2017-11-28 ENCOUNTER — Encounter: Payer: Self-pay | Admitting: Vascular Surgery

## 2017-11-28 ENCOUNTER — Ambulatory Visit (HOSPITAL_COMMUNITY)
Admission: RE | Admit: 2017-11-28 | Discharge: 2017-11-28 | Disposition: A | Discharge status: Home / Self Care | Payer: 59 | Source: Ambulatory Visit | Attending: Vascular Surgery | Admitting: Vascular Surgery

## 2017-11-28 VITALS — BP 124/81 | HR 52 | Temp 97.8°F | Resp 16 | Ht 71.5 in | Wt 220.0 lb

## 2017-11-28 DIAGNOSIS — I83893 Varicose veins of bilateral lower extremities with other complications: Secondary | ICD-10-CM | POA: Insufficient documentation
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
## 2017-12-06 ENCOUNTER — Other Ambulatory Visit: Payer: Self-pay | Admitting: *Deleted

## 2017-12-06 DIAGNOSIS — I83893 Varicose veins of bilateral lower extremities with other complications: Secondary | ICD-10-CM

## 2017-12-13 ENCOUNTER — Ambulatory Visit: Payer: 59 | Admitting: Vascular Surgery

## 2017-12-13 ENCOUNTER — Encounter: Payer: Self-pay | Admitting: Vascular Surgery

## 2017-12-13 VITALS — BP 137/86 | HR 63 | Temp 97.0°F | Resp 16 | Ht 71.5 in | Wt 220.0 lb

## 2017-12-13 DIAGNOSIS — I83891 Varicose veins of right lower extremities with other complications: Secondary | ICD-10-CM

## 2017-12-13 HISTORY — PX: ENDOVENOUS ABLATION SAPHENOUS VEIN W/ LASER: SUR449

## 2017-12-13 NOTE — Progress Notes (Signed)
Laser Ablation Procedure    Date: 12/13/2017   Jared [name] DOB:[birth year]  Consent signed: Yes    Surgeon:  Dr. Nelda Severe. Kellie Simmering  Procedure: Laser Ablation: right Greater Saphenous Vein  BP 137/86 (BP Location: Left Arm, Patient Position: Sitting, Cuff Size: Normal)   Pulse 63   Temp (!) 97 F (36.1 C) (Oral)   Resp 16   Ht 5' 11.5" (1.816 m)   Wt 220 lb (99.8 kg)   SpO2 99%   BMI 30.26 kg/m   Tumescent Anesthesia: 500 cc 0.9% NaCl with 50 cc Lidocaine HCL 1% and 15 cc 8.4% NaHCO3  Local Anesthesia: 6 cc Lidocaine HCL and NaHCO3 (ratio 2:1)  Pulsed Mode: 15 watts, 566ms delay, 1.0 duration  Total Energy:  2140 Joules            Total Pulses:   143             Total Time: 2:23   Stab Phlebectomy: 10-20 Sites: Calf  Patient tolerated procedure well  Notes: Latex allergy/ non-latex gloves used  Description of Procedure:  After marking the course of the secondary varicosities, the patient was placed on the operating table in the supine position, and the right leg was prepped and draped in sterile fashion.   Local anesthetic was administered and under ultrasound guidance the saphenous vein was accessed with a micro needle and guide wire; then the mirco puncture sheath was placed.  A guide wire was inserted saphenofemoral junction , followed by a 5 french sheath.  The position of the sheath and then the laser fiber below the junction was confirmed using the ultrasound.  Tumescent anesthesia was administered along the course of the saphenous vein using ultrasound guidance. The patient was placed in Trendelenburg position and protective laser glasses were placed on patient and staff, and the laser was fired at 15 watts continuous mode advancing 1-88mm/second for a total of 2140 joules.   For stab phlebectomies, local anesthetic was administered at the previously marked varicosities, and tumescent anesthesia was administered around the vessels.  Ten to 20 stab wounds were made using the  tip of an 11 blade. And using the vein hook, the phlebectomies were performed using a hemostat to avulse the varicosities.  Adequate hemostasis was achieved.     Steri strips were applied to the stab wounds and ABD pads and thigh high compression stockings were applied.  Ace wrap bandages were applied over the phlebectomy sites and at the top of the saphenofemoral junction. Blood loss was less than 15 cc.  The patient ambulated out of the operating room having tolerated the procedure well.

## 2017-12-13 NOTE — Progress Notes (Signed)
Subjective:     Patient ID: Jared [name], male   DOB: 07/30/1956, 62 y.o.   MRN: [id number]  HPI This 62 year old male had laser ablation of the right great saphenous vein from the proximal calf to near the saphenofemoral junction +10-20 stab phlebectomy of painful varicosities performed under local tumescent anesthesia. He tolerated both procedures well. A total of 2140 J of energy was utilized.  Review of Systems     Objective:   Physical Exam BP 137/86 (BP Location: Left Arm, Patient Position: Sitting, Cuff Size: Normal)   Pulse 63   Temp (!) 97 F (36.1 C) (Oral)   Resp 16   Ht 5' 11.5" (1.816 m)   Wt 220 lb (99.8 kg)   SpO2 99%   BMI 30.26 kg/m        Assessment:     Well-tolerated laser ablation right great saphenous vein plus multiple stab phlebectomy of painful varicosities performed under local tumescent anesthesia    Plan:     Return in 1 week for venous duplex exam to confirm closure right great saphenous vein and this will complete patient's treatment regimen

## 2017-12-19 ENCOUNTER — Encounter (HOSPITAL_COMMUNITY): Payer: 59

## 2017-12-19 ENCOUNTER — Ambulatory Visit: Payer: 59 | Admitting: Vascular Surgery

## 2017-12-20 ENCOUNTER — Ambulatory Visit (HOSPITAL_COMMUNITY)
Admission: RE | Admit: 2017-12-20 | Discharge: 2017-12-20 | Disposition: A | Discharge status: Home / Self Care | Payer: 59 | Source: Ambulatory Visit | Attending: Vascular Surgery | Admitting: Vascular Surgery

## 2017-12-20 ENCOUNTER — Encounter: Payer: Self-pay | Admitting: Vascular Surgery

## 2017-12-20 ENCOUNTER — Ambulatory Visit (INDEPENDENT_AMBULATORY_CARE_PROVIDER_SITE_OTHER): Payer: 59 | Admitting: Vascular Surgery

## 2017-12-20 VITALS — BP 148/88 | HR 59 | Temp 97.7°F | Resp 20 | Ht 71.5 in | Wt 220.4 lb

## 2017-12-20 DIAGNOSIS — I83891 Varicose veins of right lower extremities with other complications: Secondary | ICD-10-CM

## 2017-12-20 DIAGNOSIS — I83893 Varicose veins of bilateral lower extremities with other complications: Secondary | ICD-10-CM
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
## 2018-07-21 ENCOUNTER — Ambulatory Visit: Payer: 59 | Admitting: Gastroenterology

## 2020-09-29 ENCOUNTER — Telehealth: Payer: Self-pay | Admitting: Unknown Physician Specialty

## 2020-09-29 NOTE — Telephone Encounter (Signed)
Called to Discuss with patient about Covid symptoms and the use of the monoclonal antibody infusion for those with mild to moderate Covid symptoms and at a high risk of hospitalization.     Pt appears to qualify for this infusion due to co-morbid conditions and/or a member of an at-risk group in accordance with the FDA Emergency Use Authorization.    Unable to reach pt   [facility]
